# Patient Record
Sex: Male | Born: 1975 | Race: Black or African American | Hispanic: No | Marital: Single | Smoking: Former smoker
Health system: Southern US, Community
[De-identification: ages and names within clinical notes are randomized; demographics above are authoritative.]

## PROBLEM LIST (undated history)

## (undated) DIAGNOSIS — I10 Essential (primary) hypertension: Secondary | ICD-10-CM

---

## 2015-02-02 ENCOUNTER — Emergency Department
Admission: EM | Admit: 2015-02-02 | Discharge: 2015-02-02 | Disposition: A | Discharge status: Home / Self Care | Payer: Self-pay | Attending: Emergency Medicine | Admitting: Emergency Medicine

## 2015-02-02 DIAGNOSIS — Z87891 Personal history of nicotine dependence: Secondary | ICD-10-CM | POA: Insufficient documentation

## 2015-02-02 DIAGNOSIS — Y998 Other external cause status: Secondary | ICD-10-CM | POA: Insufficient documentation

## 2015-02-02 DIAGNOSIS — S39012A Strain of muscle, fascia and tendon of lower back, initial encounter: Secondary | ICD-10-CM | POA: Insufficient documentation

## 2015-02-02 DIAGNOSIS — S4991XA Unspecified injury of right shoulder and upper arm, initial encounter: Secondary | ICD-10-CM | POA: Insufficient documentation

## 2015-02-02 DIAGNOSIS — Y92410 Unspecified street and highway as the place of occurrence of the external cause: Secondary | ICD-10-CM | POA: Insufficient documentation

## 2015-02-02 DIAGNOSIS — S161XXA Strain of muscle, fascia and tendon at neck level, initial encounter: Secondary | ICD-10-CM | POA: Insufficient documentation

## 2015-02-02 DIAGNOSIS — S59902A Unspecified injury of left elbow, initial encounter: Secondary | ICD-10-CM | POA: Insufficient documentation

## 2015-02-02 DIAGNOSIS — Y9389 Activity, other specified: Secondary | ICD-10-CM | POA: Insufficient documentation

## 2015-02-02 MED ORDER — CYCLOBENZAPRINE HCL 5 MG PO TABS: 5.0000 mg | ORAL_TABLET | Freq: Three times a day (TID) | ORAL | Status: DC | PRN

## 2015-02-02 MED ORDER — NAPROXEN 500 MG PO TBEC: 500.0000 mg | DELAYED_RELEASE_TABLET | Freq: Two times a day (BID) | ORAL | Status: DC
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
## 2015-02-02 NOTE — Discharge Instructions (Signed)
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 skin and the bag.  Leave the ice on for 15-20 minutes, 03-04 times a day.  Drink enough fluids to keep your pee (urine) clear or pale yellow.  Do not drink alcohol.  Take a warm shower or bath 1 or 2 times a day. This helps your sore muscles.  Return to activities as  told by your doctor. Be careful when lifting. Lifting can make neck or back pain worse.  Only take medicine as told by your doctor. Do not use aspirin. GET HELP RIGHT AWAY IF:   Your arms or legs tingle, feel weak, or lose feeling (numbness).  You have headaches that do not get better with medicine.  You have neck pain, especially in the middle of the back of your neck.  You cannot control when you pee (urinate) or poop (bowel movement).  Pain is getting worse in any part of your body.  You are short of breath, dizzy, or pass out (faint).  You have chest pain.  You feel sick to your stomach (nauseous), throw up (vomit), or sweat.  You have belly (abdominal) pain that gets worse.  There is blood in your  pee, poop, or throw up.  You have pain in your shoulder (shoulder strap areas).  Your problems are getting worse. MAKE SURE YOU:   Understand these instructions.  Will watch your condition.  Will get help right away if you are not doing well or get worse.   This information is not intended to replace advice given to you by your health care provider. Make sure you discuss any questions you have with your health care provider.   Document Released: 06/27/2007 Document Revised: 04/02/2011 Document Reviewed: 06/07/2010 Elsevier Interactive Patient Education [employer]2016 Elsevier Inc.  Your exam was essentially normal today. You should take the prescription meds as directed. Apply moist heat to any sore muscles. Follow-up with [facility] for any ongoing symptoms.
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
## 2016-01-25 ENCOUNTER — Emergency Department: Payer: Self-pay

## 2016-01-25 ENCOUNTER — Encounter: Payer: Self-pay | Admitting: Emergency Medicine

## 2016-01-25 ENCOUNTER — Emergency Department
Admission: EM | Admit: 2016-01-25 | Discharge: 2016-01-25 | Disposition: A | Discharge status: Home / Self Care | Payer: Self-pay | Attending: Emergency Medicine | Admitting: Emergency Medicine

## 2016-01-25 DIAGNOSIS — Z87891 Personal history of nicotine dependence: Secondary | ICD-10-CM | POA: Insufficient documentation

## 2016-01-25 DIAGNOSIS — Y92410 Unspecified street and highway as the place of occurrence of the external cause: Secondary | ICD-10-CM | POA: Insufficient documentation

## 2016-01-25 DIAGNOSIS — Y939 Activity, unspecified: Secondary | ICD-10-CM | POA: Insufficient documentation

## 2016-01-25 DIAGNOSIS — S20211A Contusion of right front wall of thorax, initial encounter: Secondary | ICD-10-CM | POA: Insufficient documentation

## 2016-01-25 DIAGNOSIS — Y990 Civilian activity done for income or pay: Secondary | ICD-10-CM | POA: Insufficient documentation

## 2016-01-25 DIAGNOSIS — S298XXA Other specified injuries of thorax, initial encounter: Secondary | ICD-10-CM

## 2016-01-25 DIAGNOSIS — Z791 Long term (current) use of non-steroidal anti-inflammatories (NSAID): Secondary | ICD-10-CM | POA: Insufficient documentation

## 2016-01-25 MED ORDER — CYCLOBENZAPRINE HCL 5 MG PO TABS: 5.0000 mg | ORAL_TABLET | Freq: Three times a day (TID) | ORAL | 0 refills | Status: AC | PRN

## 2016-01-25 MED ORDER — TRAMADOL HCL 50 MG PO TABS: 50.0000 mg | ORAL_TABLET | Freq: Two times a day (BID) | ORAL | 0 refills | Status: AC

## 2016-01-25 MED ORDER — IBUPROFEN 800 MG PO TABS
800.0000 mg | ORAL_TABLET | Freq: Once | ORAL | Status: AC
  Administered 2016-01-25: 800 mg via ORAL
  Filled 2016-01-25: qty 1
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
## 2016-01-25 NOTE — ED Notes (Signed)
Attempted to call supervisor again, had pt attempt to call supervisor.  No answer or return call.
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
## 2016-01-25 NOTE — Discharge Instructions (Signed)
Take the prescription meds as directed. Apply ice to the chest wall for pain relief. [facility] or your company's medical provider as needed.
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
## 2016-01-25 NOTE — ED Notes (Signed)
Pts supervisor called for WC needs.  Message left w/ instructions to call back  [phone]

## 2016-05-02 ENCOUNTER — Encounter: Payer: Self-pay | Admitting: Radiology

## 2016-05-02 ENCOUNTER — Emergency Department: Payer: Self-pay

## 2016-05-02 ENCOUNTER — Emergency Department
Admission: EM | Admit: 2016-05-02 | Discharge: 2016-05-02 | Disposition: A | Discharge status: Home / Self Care | Payer: Self-pay | Attending: Emergency Medicine | Admitting: Emergency Medicine

## 2016-05-02 DIAGNOSIS — I10 Essential (primary) hypertension: Secondary | ICD-10-CM | POA: Insufficient documentation

## 2016-05-02 DIAGNOSIS — Z87891 Personal history of nicotine dependence: Secondary | ICD-10-CM | POA: Insufficient documentation

## 2016-05-02 DIAGNOSIS — R1030 Lower abdominal pain, unspecified: Secondary | ICD-10-CM

## 2016-05-02 DIAGNOSIS — N451 Epididymitis: Secondary | ICD-10-CM | POA: Insufficient documentation

## 2016-05-02 HISTORY — DX: Essential (primary) hypertension: I10

## 2016-05-02 LAB — COMPREHENSIVE METABOLIC PANEL
ALK PHOS: 57 U/L (ref 38–126)
ALT: 70 U/L — ABNORMAL HIGH (ref 17–63)
ANION GAP: 6 (ref 5–15)
AST: 38 U/L (ref 15–41)
Albumin: 4.4 g/dL (ref 3.5–5.0)
BILIRUBIN TOTAL: 1 mg/dL (ref 0.3–1.2)
BUN: 11 mg/dL (ref 6–20)
CALCIUM: 9.4 mg/dL (ref 8.9–10.3)
CO2: 30 mmol/L (ref 22–32)
Chloride: 102 mmol/L (ref 101–111)
Creatinine, Ser: 1.41 mg/dL — ABNORMAL HIGH (ref 0.61–1.24)
Glucose, Bld: 115 mg/dL — ABNORMAL HIGH (ref 65–99)
POTASSIUM: 4.1 mmol/L (ref 3.5–5.1)
Sodium: 138 mmol/L (ref 135–145)
Total Protein: 7.4 g/dL (ref 6.5–8.1)

## 2016-05-02 LAB — URINALYSIS, COMPLETE (UACMP) WITH MICROSCOPIC
BACTERIA UA: NONE SEEN
Bilirubin Urine: NEGATIVE
GLUCOSE, UA: NEGATIVE mg/dL
HGB URINE DIPSTICK: NEGATIVE
Ketones, ur: NEGATIVE mg/dL
LEUKOCYTES UA: NEGATIVE
Nitrite: NEGATIVE
Protein, ur: NEGATIVE mg/dL
SPECIFIC GRAVITY, URINE: 1.026 (ref 1.005–1.030)
Squamous Epithelial / LPF: NONE SEEN
pH: 6 (ref 5.0–8.0)

## 2016-05-02 LAB — CBC
HEMATOCRIT: 44 % (ref 40.0–52.0)
HEMOGLOBIN: 14.8 g/dL (ref 13.0–18.0)
MCH: 29.6 pg (ref 26.0–34.0)
MCHC: 33.5 g/dL (ref 32.0–36.0)
MCV: 88.4 fL (ref 80.0–100.0)
Platelets: 260 10*3/uL (ref 150–440)
RBC: 4.98 MIL/uL (ref 4.40–5.90)
RDW: 14.2 % (ref 11.5–14.5)
WBC: 6.3 10*3/uL (ref 3.8–10.6)

## 2016-05-02 LAB — LIPASE, BLOOD: Lipase: 30 U/L (ref 11–51)

## 2016-05-02 MED ORDER — IOPAMIDOL (ISOVUE-300) INJECTION 61%
30.0000 mL | Freq: Once | INTRAVENOUS | Status: AC
  Administered 2016-05-02: 30 mL via ORAL
  Filled 2016-05-02: qty 30

## 2016-05-02 MED ORDER — NAPROXEN 500 MG PO TABS: 500.0000 mg | ORAL_TABLET | Freq: Two times a day (BID) | ORAL | 0 refills | Status: AC

## 2016-05-02 MED ORDER — IOPAMIDOL (ISOVUE-300) INJECTION 61%
100.0000 mL | Freq: Once | INTRAVENOUS | Status: AC | PRN
  Administered 2016-05-02: 100 mL via INTRAVENOUS
  Filled 2016-05-02: qty 100

## 2016-05-02 MED ORDER — ONDANSETRON 4 MG PO TBDP: 4.0000 mg | ORAL_TABLET | Freq: Three times a day (TID) | ORAL | 0 refills | Status: AC | PRN

## 2016-05-02 MED ORDER — LEVOFLOXACIN 500 MG PO TABS: 500.0000 mg | ORAL_TABLET | Freq: Every day | ORAL | 0 refills | Status: AC

## 2016-05-02 NOTE — ED Provider Notes (Signed)
[facility] Emergency Department Provider Note  ____________________________________________  Time seen: Approximately 4:20 PM  I have reviewed the triage vital signs and the nursing notes.   HISTORY  Chief Complaint Abdominal Pain    HPI Daniel Montes is a 41 y.o. male who complains of right lower quadrant abdominal pain radiating to his groin. Associated urinary frequency and dysuria and urgency. Also reports pain with ejaculation but no blood in ejaculate. No blood in his urine. No fever or chills. Never had anything like this before. Denies penile discharge or high-risk sexual behavior. Reports the pain is been going on for about a week, seemed to happen after he was playing softball.    Past Medical History:  Diagnosis Date  . Hypertension      There are no active problems to display for this patient.    No past surgical history on file. None  Prior to Admission medications   Medication Sig Start Date End Date Taking? Authorizing Provider  cyclobenzaprine (FLEXERIL) 5 MG tablet Take 1 tablet (5 mg total) by mouth 3 (three) times daily as needed for muscle spasms. 01/25/16   Jenise V Bacon Menshew, PA-C  levofloxacin (LEVAQUIN) 500 MG tablet Take 1 tablet (500 mg total) by mouth daily. 05/02/16 05/12/16  Sharman Cheek, MD  naproxen (NAPROSYN) 500 MG tablet Take 1 tablet (500 mg total) by mouth 2 (two) times daily with a meal. 05/02/16   Sharman Cheek, MD  ondansetron (ZOFRAN ODT) 4 MG disintegrating tablet Take 1 tablet (4 mg total) by mouth every 8 (eight) hours as needed for nausea or vomiting. 05/02/16   Sharman Cheek, MD  traMADol (ULTRAM) 50 MG tablet Take 1 tablet (50 mg total) by mouth 2 (two) times daily. 01/25/16   Charlesetta Ivory Menshew, PA-C     Allergies Patient has no known allergies.   No family history on file.  Social History Social History  Substance Use Topics  . Smoking status: Former Games developer  . Smokeless tobacco:  Never Used  . Alcohol use Yes    Review of Systems  Constitutional:   No fever or chills.  ENT:   No sore throat. No rhinorrhea. Cardiovascular:   No chest pain. Respiratory:   No dyspnea or cough. Gastrointestinal:   Positive as above for abdominal pain without vomiting and diarrhea.  Genitourinary:   Positive as above for dysuria or frequency urgency. Musculoskeletal:   Negative for focal pain or swelling Neurological:   Negative for headaches 10-point ROS otherwise negative.  ____________________________________________   PHYSICAL EXAM:  VITAL SIGNS: ED Triage Vitals [05/02/16 1353]  Enc Vitals Group     BP (!) 143/94     Pulse Rate 64     Resp 18     Temp 98 F (36.7 C)     Temp Source Oral     SpO2 99 %     Weight 212 lb (96.2 kg)     Height  (1.727 m)     Head Circumference      Peak Flow      Pain Score      Pain Loc      Pain Edu?      Excl. in GC?     Vital signs reviewed, nursing assessments reviewed.   Constitutional:   Alert and oriented. Well appearing and in no distress. Eyes:   No scleral icterus. No conjunctival pallor. PERRL. EOMI.  No nystagmus. ENT   Head:   Normocephalic and atraumatic.  Nose:   No congestion/rhinnorhea. No septal hematoma   Mouth/Throat:   MMM, no pharyngeal erythema. No peritonsillar mass.    Neck:   No stridor. No SubQ emphysema. No meningismus. Hematological/Lymphatic/Immunilogical:   No cervical lymphadenopathy. Cardiovascular:   RRR. Symmetric bilateral radial and DP pulses.  No murmurs.  Respiratory:   Normal respiratory effort without tachypnea nor retractions. Breath sounds are clear and equal bilaterally. No wheezes/rales/rhonchi. Gastrointestinal:   Soft With generalized tenderness. There is voluntary guarding. Non distended. There is no CVA tenderness.  No rebound or rigidity. Genitourinary:   Exam performed with PA student Eileen Stanford at bedside. Normal genitalia, circumcised male. No discharge from  the meatus. No lesions. Testicles nontender. Minimal epididymal tenderness on the left. No edema or induration swelling or masses. No palpable hernias. Musculoskeletal:   Normal range of motion in all extremities. No joint effusions.  No lower extremity tenderness.  No edema. Neurologic:   Normal speech and language.  CN 2-10 normal. Motor grossly intact. No gross focal neurologic deficits are appreciated.  Skin:    Skin is warm, dry and intact. No rash noted.  No petechiae, purpura, or bullae.  ____________________________________________    LABS (pertinent positives/negatives) (all labs ordered are listed, but only abnormal results are displayed) Labs Reviewed  COMPREHENSIVE METABOLIC PANEL - Abnormal; Notable for the following:       Result Value   Glucose, Bld 115 (*)    Creatinine, Ser 1.41 (*)    ALT 70 (*)    All other components within normal limits  URINALYSIS, COMPLETE (UACMP) WITH MICROSCOPIC - Abnormal; Notable for the following:    Color, Urine YELLOW (*)    APPearance CLEAR (*)    All other components within normal limits  LIPASE, BLOOD  CBC   ____________________________________________   EKG    ____________________________________________    RADIOLOGY  Ct Abdomen Pelvis W Contrast  Result Date: 05/02/2016 CLINICAL DATA:  Generalized abdominal pain with guarding, possibly injured playing softball, some urinary frequency EXAM: CT ABDOMEN AND PELVIS WITH CONTRAST TECHNIQUE: Multidetector CT imaging of the abdomen and pelvis was performed using the standard protocol following bolus administration of intravenous contrast. CONTRAST:  ISOVUE-300 IOPAMIDOL (ISOVUE-300) INJECTION 61% COMPARISON:  None. The appendix and terminal ileum are unremarkable. No renal or ureteral calculi are seen. FINDINGS: Lower chest: A 3 mm mm noncalcified nodule is present in the right lower lobe anteriorly on image 2 series 4 of questionable significance. Otherwise the lung bases  clear. The heart is within normal limits in size. Hepatobiliary: The liver is somewhat low in attenuation which may indicate fatty infiltration with some sparing near the gallbladder. The gallbladder is contracted and no calcified gallstones are seen. Pancreas: The pancreas is normal in size and the pancreatic duct is not dilated. Spleen: The spleen is unremarkable. Adrenals/Urinary Tract: The adrenal glands appear normal in size. The kidneys enhance with no calculus or mass. On delayed images, the pelvocaliceal systems are unremarkable. The ureters are normal in caliber. The urinary bladder is moderately well distended with no abnormality noted. Stomach/Bowel: The stomach is well distended with oral contrast and food debris. No small bowel distention is noted. The colon is largely decompressed. The terminal ileum is fluid filled but no abnormality is noted. The appendix is unremarkable. Vascular/Lymphatic: The abdominal aorta is normal in caliber. No adenopathy is seen. Reproductive: The prostate gland is normal size. Other: None. Musculoskeletal: The lumbar vertebrae are normal in alignment. Intervertebral disc spaces appear normal. The SI joints  are corticated. IMPRESSION: 1. No explanation for the patient's pain is seen. 2. Probable mild fatty infiltration of the liver. Correlate with LFTs. 3. Single 3 mm noncalcified nodule in the right lower lobe of doubtful significance. Electronically Signed   By: Dwyane Dee M.D.   On: 05/02/2016 16:31    ____________________________________________   PROCEDURES Procedures  ____________________________________________   INITIAL IMPRESSION / ASSESSMENT AND PLAN / ED COURSE  Pertinent labs & imaging results that were available during my care of the patient were reviewed by me and considered in my medical decision making (see chart for details).  Patient well appearing no acute distress, presents with flank pain radiating to the groin, possibly UTI versus  stone versus other intra-abdominal process. He has a lot of tenderness actually for the nature of these symptoms. I highly doubt that it is vascular in nature, but we will get a CT to evaluate for other pathology. His labs and urinalysis actually are unremarkable.    ----------------------------------------- 4:50 PM on 05/02/2016 -----------------------------------------  CT negative. At this point, considering the patient's symptoms, medical history, and physical examination today, I have low suspicion for cholecystitis or biliary pathology, pancreatitis, perforation or bowel obstruction, hernia, intra-abdominal abscess, AAA or dissection, volvulus or intussusception, mesenteric ischemia, or appendicitis.  We'll treat with Levaquin for presumed epididymitis, follow-up with primary care in 1 week.       ____________________________________________   FINAL CLINICAL IMPRESSION(S) / ED DIAGNOSES  Final diagnoses:  Lower abdominal pain  Epididymitis      New Prescriptions   LEVOFLOXACIN (LEVAQUIN) 500 MG TABLET    Take 1 tablet (500 mg total) by mouth daily.   NAPROXEN (NAPROSYN) 500 MG TABLET    Take 1 tablet (500 mg total) by mouth 2 (two) times daily with a meal.   ONDANSETRON (ZOFRAN ODT) 4 MG DISINTEGRATING TABLET    Take 1 tablet (4 mg total) by mouth every 8 (eight) hours as needed for nausea or vomiting.     Portions of this note were generated with dragon dictation software. Dictation errors may occur despite best attempts at proofreading.    Sharman Cheek, MD 05/02/16 [phone]

## 2016-05-02 NOTE — Discharge Instructions (Signed)
Your CT scan was unremarkable. Take Levaquin as prescribed to treat inflammation around the testicles and follow up with your primary care doctor in one week.

## 2016-05-02 NOTE — ED Notes (Signed)
EDP at bedside  

## 2016-05-02 NOTE — ED Triage Notes (Signed)
Pt presents to ED c/o sharp abd pain all over radiates to back and right groin > 1 week; pt feels like he pulled something Saturday playing softball. Pt admits to urinary frequency.

## 2016-05-02 NOTE — ED Notes (Signed)
Pt states RLQ abd pain radiating to his groin since last week, states urinary frequency with burning, denies any vomiting, states "burning" on the left side of his bad, pt awake and alert in no acute distress
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
## 2018-10-22 IMAGING — CT CT ABD-PELV W/ CM
2 of 5 series · 15 of 46 positions shown, 17 images · IV contrast (APPLIED)
Comparison: None.

CLINICAL DATA: Generalized abdominal pain with guarding, possibly
injured playing softball, some urinary frequency

EXAM:
CT ABDOMEN AND PELVIS WITH CONTRAST
TECHNIQUE: Multidetector CT imaging of the abdomen and pelvis was performed
using the standard protocol following bolus administration of
intravenous contrast.
CONTRAST:  100mL QPF16P-TWW IOPAMIDOL (QPF16P-TWW) INJECTION 61%

[Series 2: routine abd/pel with · axial · 0.82mm/px · z∈[-669,-234]mm · 12 of 97 slices shown, 14 images]
[im 5/97  soft-tissue]
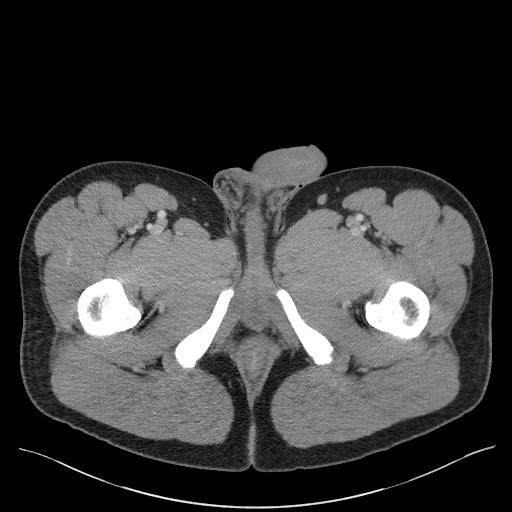
[im 5/97  bone]
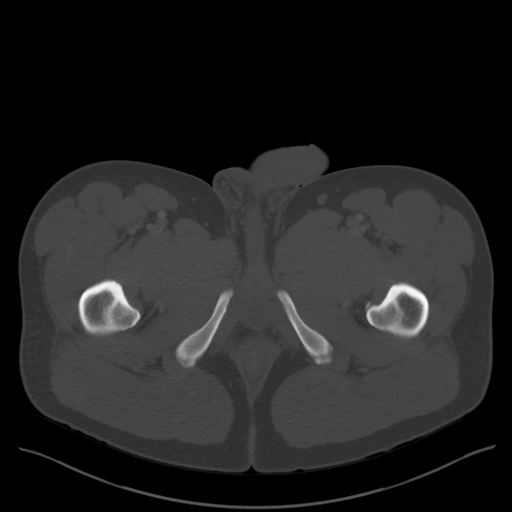
[im 15/97  soft-tissue]
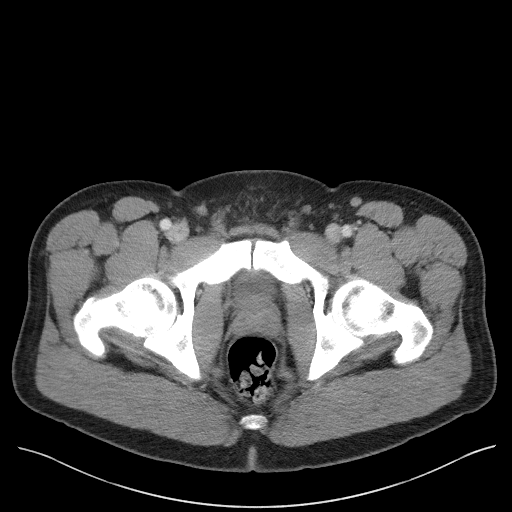
[im 20/97  soft-tissue]
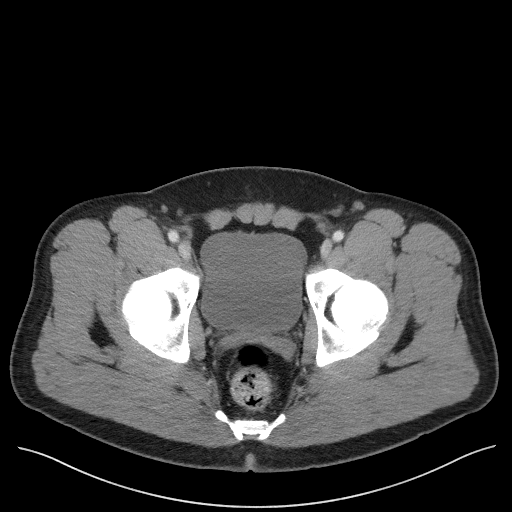
[im 29/97  soft-tissue]
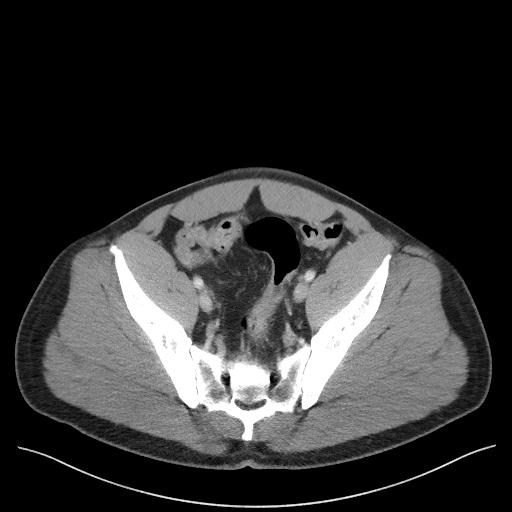
[im 39/97  soft-tissue]
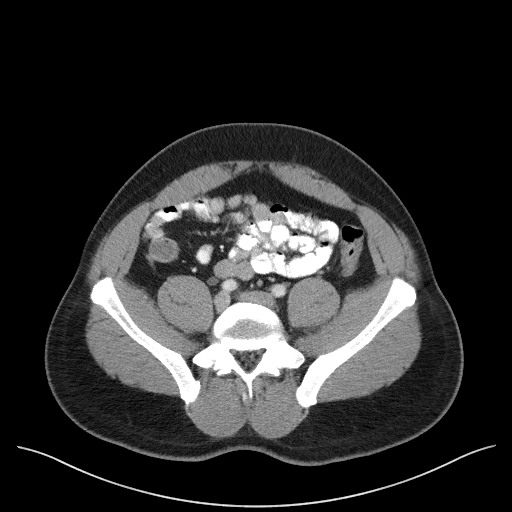
[im 44/97  soft-tissue]
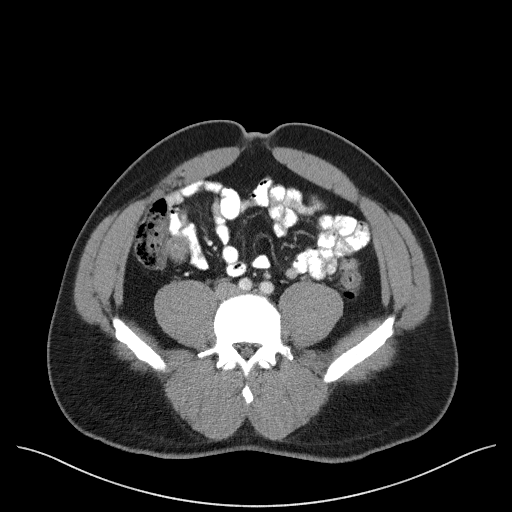
[im 53/97  soft-tissue]
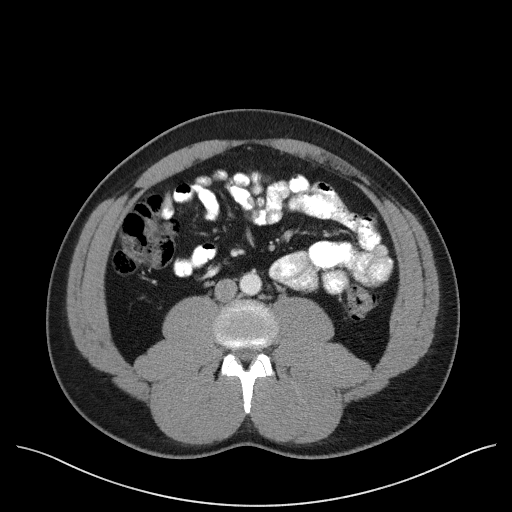
[im 58/97  soft-tissue]
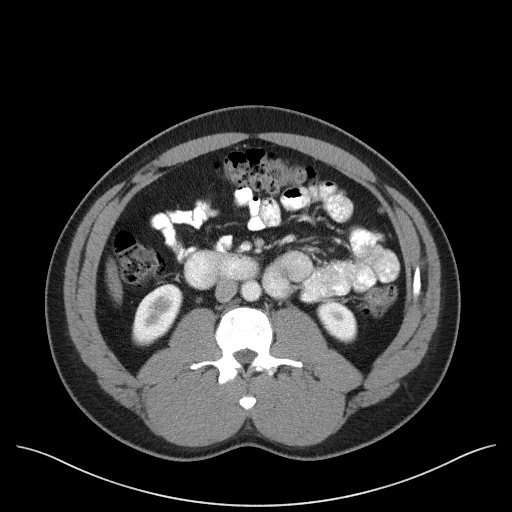
[im 68/97  soft-tissue]
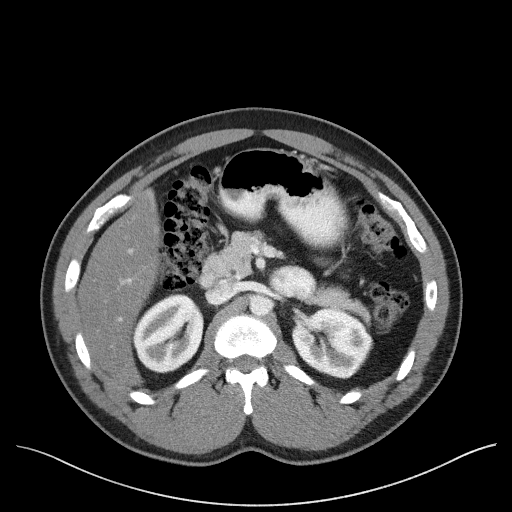
[im 68/97  bone]
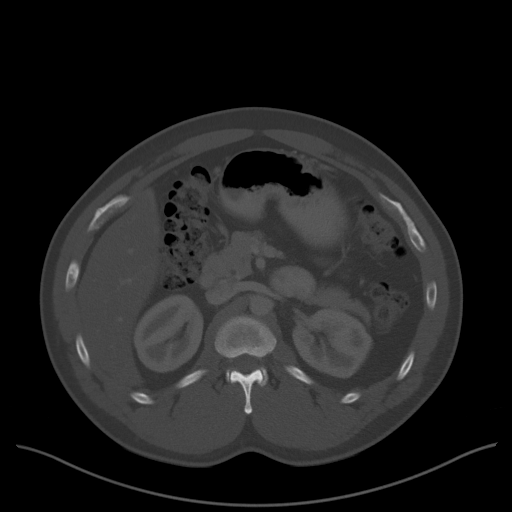
[im 77/97  soft-tissue]
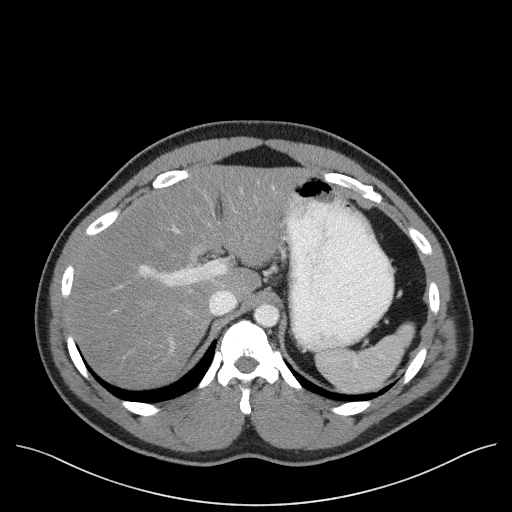
[im 82/97  soft-tissue]
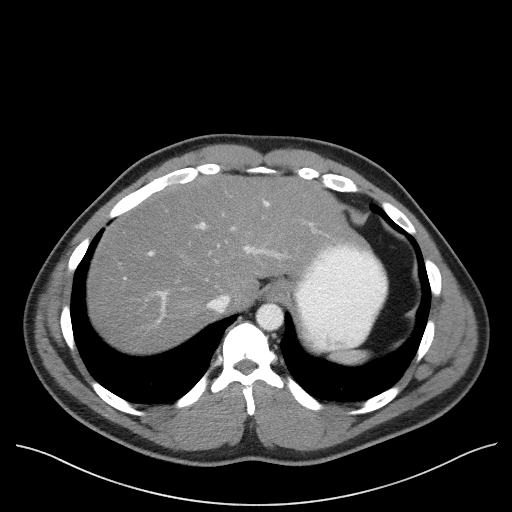
[im 92/97  soft-tissue]
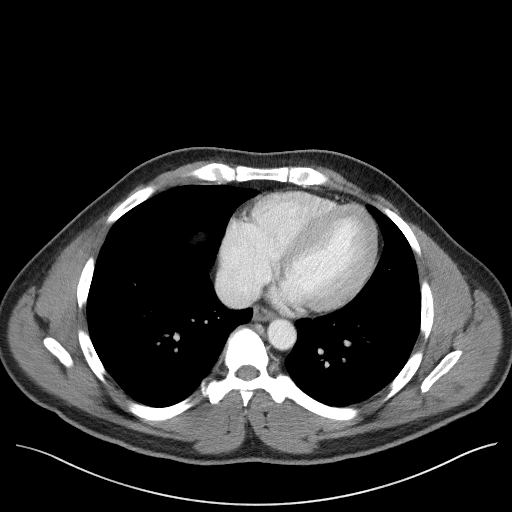

[Series 5: coronal st · coronal · 0.68mm/px · 3 of 95 slices shown]
[im 32/95  soft-tissue]
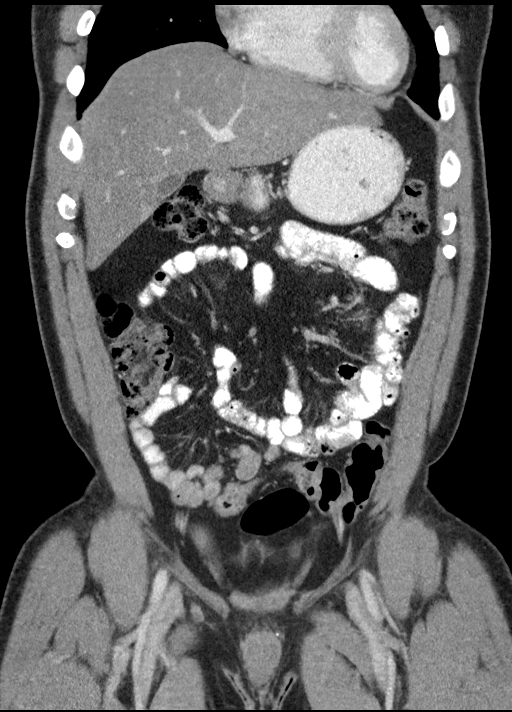
[im 42/95  soft-tissue]
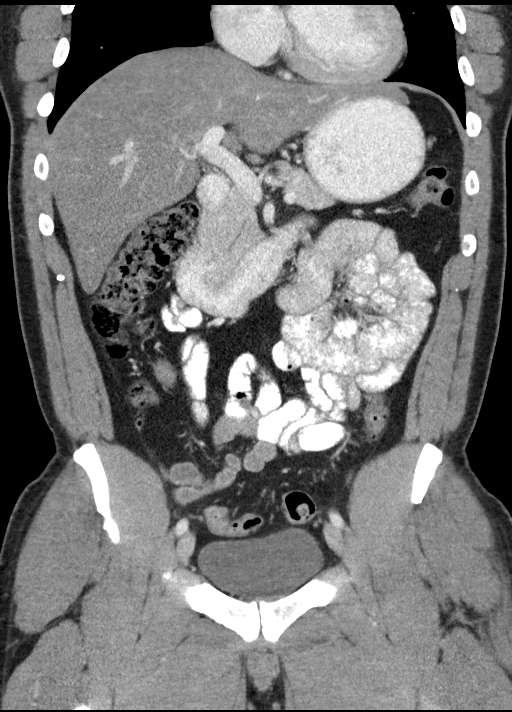
[im 53/95  soft-tissue]
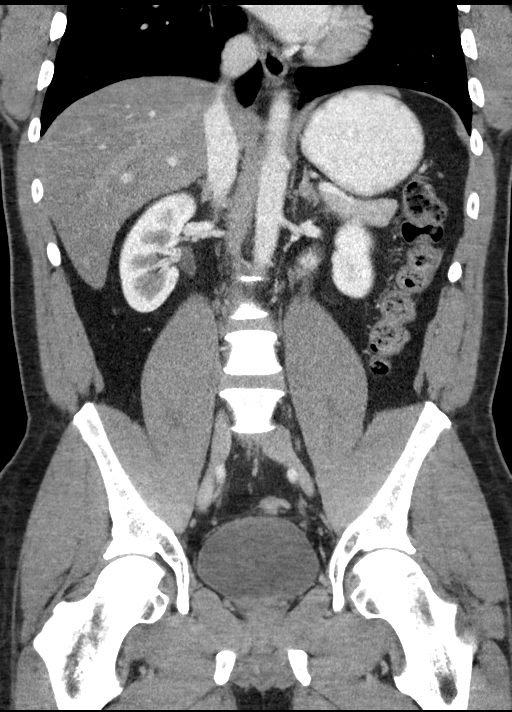

[15 of 46 positions shown; findings below may reference images not displayed]

The appendix and terminal ileum are unremarkable.
No renal or ureteral calculi are seen.
FINDINGS: Lower chest: A 3 mm mm noncalcified nodule is present in the right
lower lobe anteriorly on image 2 series 4 of questionable
significance. Otherwise the lung bases clear. The heart is within
normal limits in size.

Hepatobiliary: The liver is somewhat low in attenuation which may
indicate fatty infiltration with some sparing near the gallbladder.
The gallbladder is contracted and no calcified gallstones are seen.

Pancreas: The pancreas is normal in size and the pancreatic duct is
not dilated.

Spleen: The spleen is unremarkable.

Adrenals/Urinary Tract: The adrenal glands appear normal in size.
The kidneys enhance with no calculus or mass. On delayed images, the
pelvocaliceal systems are unremarkable. The ureters are normal in
caliber. The urinary bladder is moderately well distended with no
abnormality noted.

Stomach/Bowel: The stomach is well distended with oral contrast and
food debris. No small bowel distention is noted. The colon is
largely decompressed. The terminal ileum is fluid filled but no
abnormality is noted. The appendix is unremarkable.

Vascular/Lymphatic: The abdominal aorta is normal in caliber. No
adenopathy is seen.

Reproductive: The prostate gland is normal size.

Other: None.

Musculoskeletal: The lumbar vertebrae are normal in alignment.
Intervertebral disc spaces appear normal. The SI joints are
corticated.
IMPRESSION: 1. No explanation for the patient's pain is seen.
2. Probable mild fatty infiltration of the liver. Correlate with
LFTs.
3. Single 3 mm noncalcified nodule in the right lower lobe of
doubtful significance.
